# Patient Record
Sex: Male | Born: 1950 | Race: White | Hispanic: No | Marital: Married | Smoking: Never smoker
Health system: Southern US, Community
[De-identification: ages and names within clinical notes are randomized; demographics above are authoritative.]

## PROBLEM LIST (undated history)

## (undated) DIAGNOSIS — I10 Essential (primary) hypertension: Secondary | ICD-10-CM

## (undated) DIAGNOSIS — E785 Hyperlipidemia, unspecified: Secondary | ICD-10-CM

## (undated) DIAGNOSIS — I219 Acute myocardial infarction, unspecified: Secondary | ICD-10-CM

## (undated) HISTORY — DX: Essential (primary) hypertension: I10

## (undated) HISTORY — DX: Hyperlipidemia, unspecified: E78.5

## (undated) HISTORY — DX: Acute myocardial infarction, unspecified: I21.9

---

## 2003-12-25 HISTORY — PX: CORONARY ARTERY BYPASS GRAFT: SHX141

## 2004-01-17 ENCOUNTER — Inpatient Hospital Stay (HOSPITAL_COMMUNITY)
Admission: AD | Admit: 2004-01-17 | Discharge: 2004-01-23 | Payer: Self-pay | Admitting: Thoracic Surgery (Cardiothoracic Vascular Surgery)

## 2004-05-26 ENCOUNTER — Encounter: Payer: Self-pay | Admitting: Internal Medicine

## 2005-10-24 ENCOUNTER — Ambulatory Visit: Payer: Self-pay | Admitting: Internal Medicine

## 2009-08-31 HISTORY — PX: JOINT REPLACEMENT: SHX530

## 2009-09-04 ENCOUNTER — Ambulatory Visit: Payer: Self-pay | Admitting: Specialist

## 2009-09-20 ENCOUNTER — Inpatient Hospital Stay: Payer: Self-pay | Admitting: Specialist

## 2009-09-25 ENCOUNTER — Encounter: Payer: Self-pay | Admitting: Internal Medicine

## 2009-09-26 ENCOUNTER — Encounter: Payer: Self-pay | Admitting: Internal Medicine

## 2011-08-31 LAB — HM COLONOSCOPY: HM Colonoscopy: 1

## 2011-09-10 ENCOUNTER — Ambulatory Visit: Payer: Self-pay | Admitting: General Surgery

## 2011-09-10 LAB — HM COLONOSCOPY

## 2011-09-12 LAB — PATHOLOGY REPORT

## 2013-09-07 ENCOUNTER — Ambulatory Visit: Payer: Self-pay | Admitting: Family Medicine

## 2013-09-07 LAB — PSA: PSA: 1.2

## 2013-09-07 LAB — TSH: TSH: 2.24 u[IU]/mL (ref <=5.90)

## 2014-07-19 DIAGNOSIS — I25810 Atherosclerosis of coronary artery bypass graft(s) without angina pectoris: Secondary | ICD-10-CM | POA: Insufficient documentation

## 2014-10-24 IMAGING — CR DG CHEST 2V
1 series · 2 of 2 positions shown · non-contrast
Comparison: None.

CLINICAL DATA: One-week history of dyspnea, previous cardiac bowel
surgery and coronary artery bypass

EXAM:
CHEST  2 VIEW

[Series 1: pa · 0.17mm/px · 2 of 2 slices shown]
[im 1/2]
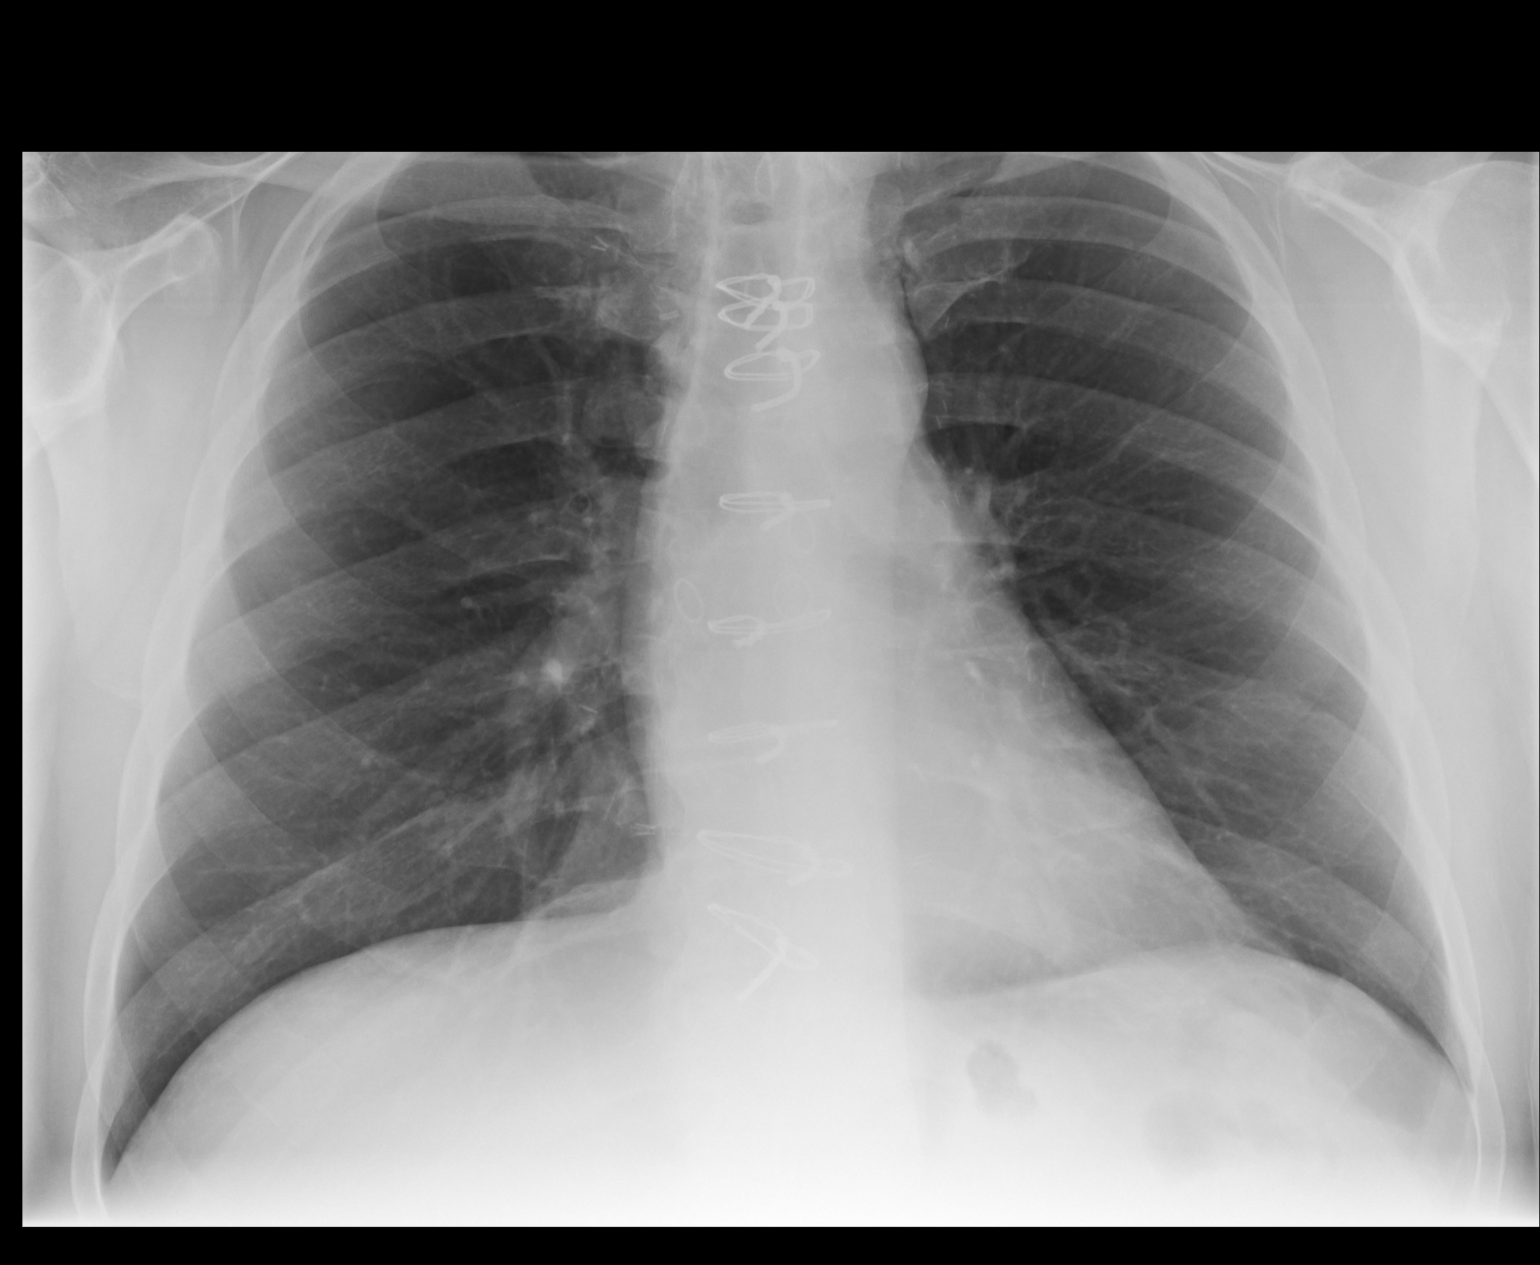
[im 2/2]
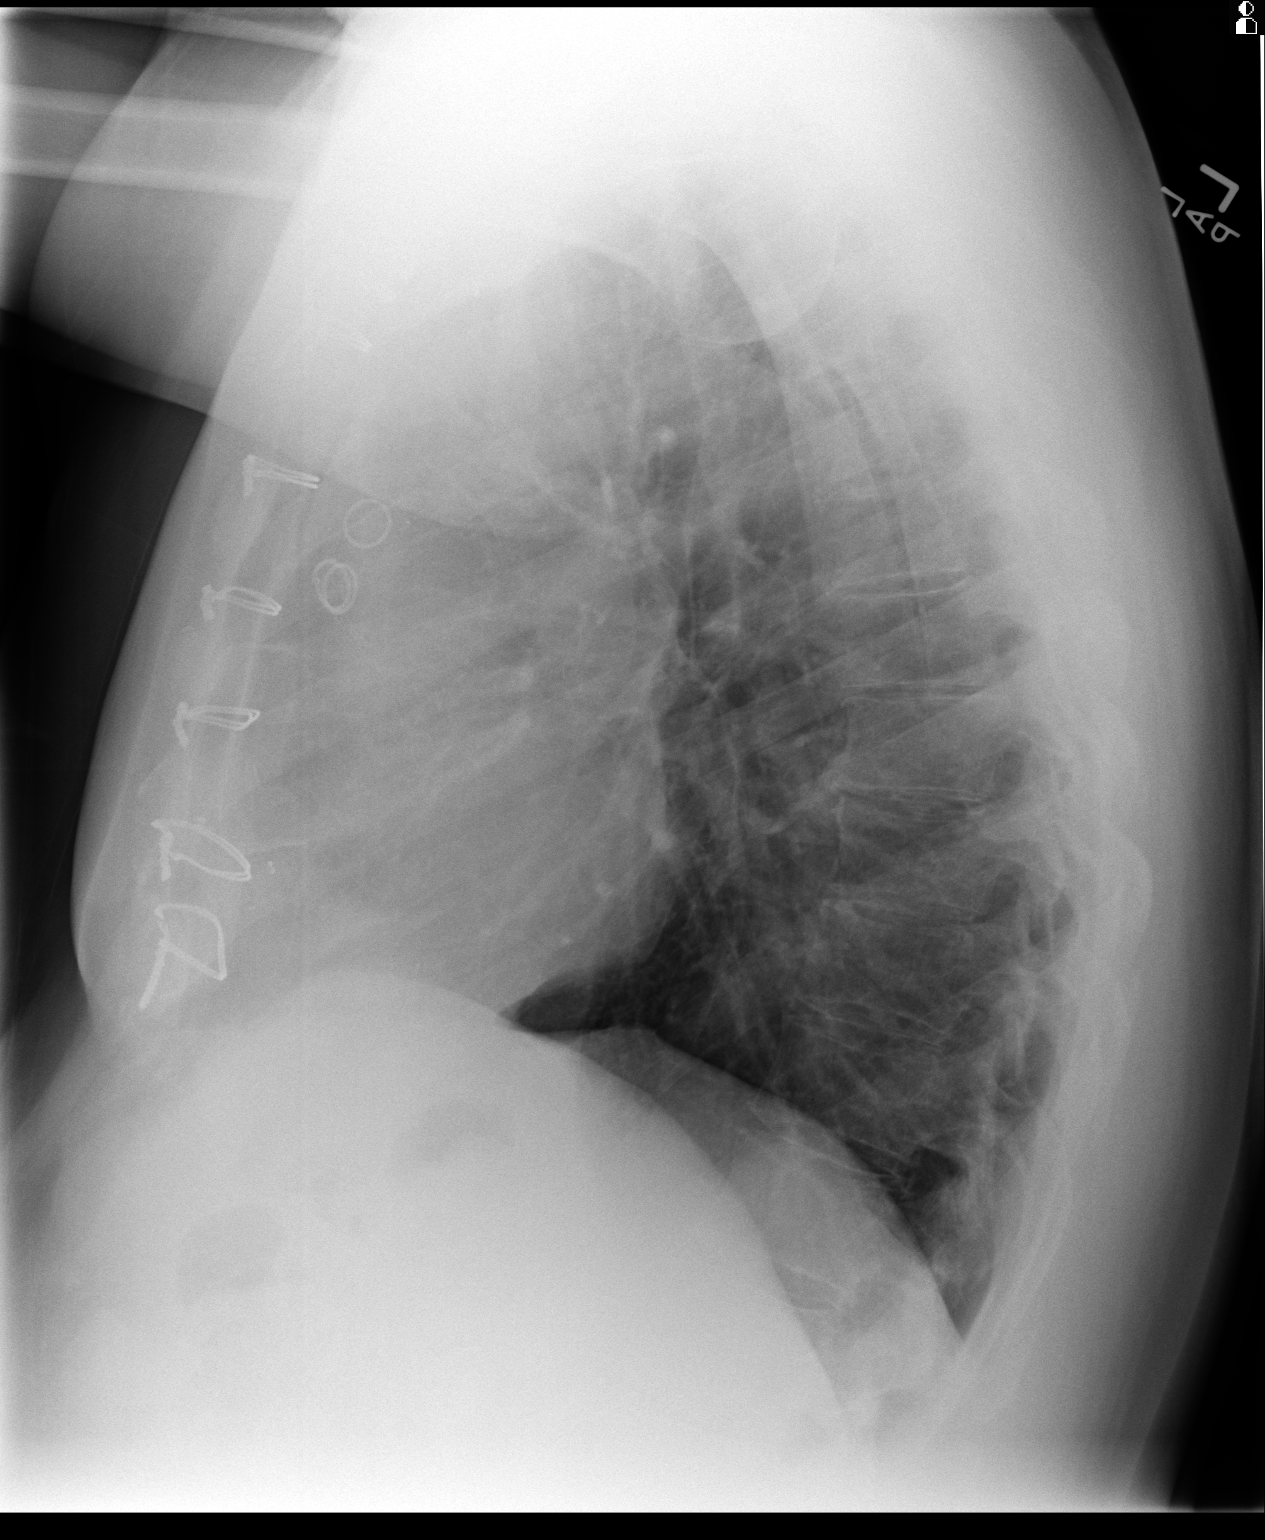

[2 of 2 positions shown; findings below may reference images not displayed]

FINDINGS: The lungs are adequately inflated and clear. The cardiopericardial
silhouette is normal in size. The pulmonary vascularity is not
engorged. The contour of the thoracic aorta is normal. There is no
pleural effusion. The mediastinum is normal in width. The observed
portions of the bony thorax appear normal.
IMPRESSION: There is no evidence of CHF nor other acute cardiopulmonary
abnormality.

## 2014-11-26 DIAGNOSIS — E785 Hyperlipidemia, unspecified: Secondary | ICD-10-CM | POA: Insufficient documentation

## 2014-11-26 DIAGNOSIS — M1990 Unspecified osteoarthritis, unspecified site: Secondary | ICD-10-CM | POA: Insufficient documentation

## 2014-11-26 DIAGNOSIS — I10 Essential (primary) hypertension: Secondary | ICD-10-CM | POA: Insufficient documentation

## 2014-11-26 DIAGNOSIS — I2510 Atherosclerotic heart disease of native coronary artery without angina pectoris: Secondary | ICD-10-CM | POA: Insufficient documentation

## 2014-11-26 DIAGNOSIS — K5790 Diverticulosis of intestine, part unspecified, without perforation or abscess without bleeding: Secondary | ICD-10-CM | POA: Insufficient documentation

## 2014-12-30 ENCOUNTER — Other Ambulatory Visit: Payer: Self-pay | Admitting: Family Medicine

## 2014-12-30 ENCOUNTER — Ambulatory Visit
Admission: RE | Admit: 2014-12-30 | Discharge: 2014-12-30 | Disposition: A | Discharge status: Home / Self Care | Payer: Federal, State, Local not specified - PPO | Source: Ambulatory Visit | Attending: Family Medicine | Admitting: Family Medicine

## 2014-12-30 ENCOUNTER — Ambulatory Visit
Admission: RE | Admit: 2014-12-30 | Discharge: 2014-12-30 | Disposition: A | Discharge status: Home / Self Care | Payer: Federal, State, Local not specified - PPO | Attending: Family Medicine | Admitting: Family Medicine

## 2014-12-30 DIAGNOSIS — R05 Cough: Secondary | ICD-10-CM

## 2014-12-30 DIAGNOSIS — Z951 Presence of aortocoronary bypass graft: Secondary | ICD-10-CM | POA: Insufficient documentation

## 2014-12-30 DIAGNOSIS — R059 Cough, unspecified: Secondary | ICD-10-CM

## 2015-02-24 ENCOUNTER — Encounter: Payer: Self-pay | Admitting: Family Medicine

## 2015-02-24 ENCOUNTER — Other Ambulatory Visit: Payer: Self-pay

## 2015-02-24 ENCOUNTER — Ambulatory Visit (INDEPENDENT_AMBULATORY_CARE_PROVIDER_SITE_OTHER): Payer: Federal, State, Local not specified - PPO | Admitting: Family Medicine

## 2015-02-24 VITALS — BP 120/74 | HR 96 | Temp 97.6°F | Resp 16 | Ht 73.0 in | Wt 220.0 lb

## 2015-02-24 DIAGNOSIS — Z0000 Encounter for general adult medical examination without abnormal findings: Secondary | ICD-10-CM

## 2015-02-24 NOTE — Progress Notes (Signed)
Patient ID: Kenneth Fuentes, male   DOB: 02-07-1951, 64 y.o.   MRN: [id number]       Patient: Kenneth Fuentes, Male    DOB: 02-Sep-1950, 64 y.o.   MRN: [id number] Visit Date: 02/24/2015  Today's Provider: Margarita Rana, MD   Chief Complaint  Patient presents with  . Annual Exam   Subjective:    Annual physical exam Kenneth Fuentes is a 64 y.o. male who presents today for health maintenance and complete physical. He feels well. He reports exercising "some", walking. He reports he is sleeping well. Pt sees cardiology. No current cardiac symptoms. Enjoying retirement.   Last CPE- 09/06/2013 Last Colonoscopy- 09/10/2011 (polyp and diverticulitis) Pneumovax 23- 07/30/2014  -----------------------------------------------------------------   Review of Systems  Constitutional: Negative.   HENT: Negative.   Eyes: Negative.   Respiratory: Negative.   Cardiovascular: Negative.   Gastrointestinal: Negative.   Endocrine: Negative.   Genitourinary: Negative.   Musculoskeletal: Negative.   Skin: Negative.   Allergic/Immunologic: Negative.   Neurological: Negative.   Hematological: Negative.   Psychiatric/Behavioral: Negative.      Social History He  reports that he has never smoked. He does not have any smokeless tobacco history on file. He reports that he does not drink alcohol or use illicit drugs.  Patient Active Problem List   Diagnosis Date Noted  . Atherosclerosis of coronary artery 11/26/2014  . DD (diverticular disease) 11/26/2014  . HLD (hyperlipidemia) 11/26/2014  . BP (high blood pressure) 11/26/2014  . Arthritis, degenerative 11/26/2014  . 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 Coronary artery bypass graft  12/2003  . Joint replacement Bilateral 08/31/2009    Family History His family history includes CAD in his father and mother; Down syndrome in his daughter; Heart attack in his  brother, brother, father, and mother; Hypertension in his father and mother.    Previous Medications   ASPIRIN 81 MG TABLET    Take by mouth daily.   ATORVASTATIN (LIPITOR) 40 MG TABLET    Take by mouth daily.   RAMIPRIL (ALTACE) 2.5 MG CAPSULE    Take by mouth.   SILDENAFIL (VIAGRA) 100 MG TABLET    Take by mouth.    Patient Care Team: Margarita Rana, MD as PCP - General (Family Medicine)     Objective:   Vitals: BP 120/74 mmHg  Pulse 96  Temp(Src) 97.6 F (36.4 C) (Oral)  Resp 16  Ht 6\' 1"  (1.854 m)  Wt 220 lb (99.791 kg)  BMI 29.03 kg/m2  Physical Exam  Constitutional: He is oriented to person, place, and time. He appears well-developed and well-nourished.  HENT:  Head: Normocephalic and atraumatic.  Right Ear: External ear normal.  Left Ear: External ear normal.  Nose: Nose normal.  Mouth/Throat: Oropharynx is clear and moist.  Eyes: Conjunctivae and EOM are normal. Pupils are equal, round, and reactive to light.  Neck: Normal range of motion. Neck supple. No tracheal deviation present. No thyromegaly present.  Cardiovascular: Normal rate, regular rhythm and normal heart sounds.   Pulmonary/Chest: Effort normal and breath sounds normal. No respiratory distress. He has no wheezes. He has no rales. He exhibits no tenderness.  Abdominal: Soft. Bowel sounds are normal.  Musculoskeletal: Normal range of motion. He exhibits no edema or tenderness.  Lymphadenopathy:    He has no cervical adenopathy.  Neurological: He is alert and oriented to person, place, and time. He has normal  reflexes.  Skin: Skin is warm and dry.  Psychiatric: He has a normal mood and affect. His behavior is normal. Judgment and thought content normal.     Depression Screen PHQ 2/9 Scores 02/24/2015  PHQ - 2 Score 0      Assessment & Plan:     Routine Health Maintenance and Physical Exam  Exercise Activities and Dietary recommendations Goals    None      Immunization History    Administered Date(s) Administered  . Pneumococcal Polysaccharide-23 07/05/2004, 07/30/2014  . Tdap 07/30/2014    Health Maintenance  Topic Date Due  . HIV Screening  12/22/1964  . ZOSTAVAX  12/22/2009  . INFLUENZA VACCINE  03/27/2015  . PNA vac Low Risk Adult (1 of 2 - PCV13) 07/31/2015  . COLONOSCOPY  09/09/2021  . TETANUS/TDAP  07/30/2024      Discussed health benefits of physical activity, and encouraged him to engage in regular exercise appropriate for his age and condition.    --------------------------------------------------------------------  Pt aware his PCV 13 is due in December 2016. Defer EKG to cardiology. Colonoscopy UTD. Advised pt of pros vs cons of checking PSA yearly. Pt agrees to defer this test this year, but will check next year at physical.     Patient seen and examined by Jerrell Belfast, MD, and note scribed by Renaldo Fiddler, CMA. I have reviewed the document for accuracy and completeness and I agree with above. Jerrell Belfast, MD    Margarita Rana, MD

## 2015-02-24 NOTE — Progress Notes (Deleted)
Patient ID: Kenneth Fuentes, male   DOB: [birth year], 64 y.o.   MRN: [id number]       Patient: Kenneth Fuentes, Male    DOB: May 09, 1951, 63 y.o.   MRN: [id number] Visit Date: 02/24/2015  Today's Provider: Margarita Rana, MD   No chief complaint on file.  Subjective:    Annual wellness visit Kenneth Fuentes is a 64 y.o. male who presents today for his [employer] to [employer] Visit. He feels {DESC; WELL/FAIRLY WELL/POORLY:18703}. He reports exercising ***. He reports he is sleeping {DESC; WELL/FAIRLY WELL/POORLY:18703}.  -----------------------------------------------------------   Review of Systems  History   Social History  . Marital Status: Married    Spouse Name: N/A  . Number of Children: N/A  . Years of Education: N/A   Occupational History  . Not on file.   Social History Main Topics  . Smoking status: Never Smoker   . Smokeless tobacco: Not on file  . Alcohol Use: No  . Drug Use: No  . Sexual Activity: Not on file   Other Topics Concern  . Not on file   Social History Narrative    Patient Active Problem List   Diagnosis Date Noted  . Atherosclerosis of coronary artery 11/26/2014  . DD (diverticular disease) 11/26/2014  . HLD (hyperlipidemia) 11/26/2014  . BP (high blood pressure) 11/26/2014  . Arthritis, degenerative 11/26/2014  . 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 Coronary artery bypass graft  12/2003  . Joint replacement Bilateral 08/31/2009    His family history includes CAD in his father and mother; Down syndrome in his daughter; Heart attack in his brother, brother, father, and mother; Hypertension in his father and mother.    Previous Medications   ASPIRIN 81 MG TABLET    Take by mouth daily.   ATORVASTATIN (LIPITOR) 40 MG TABLET    Take by mouth daily.   RAMIPRIL (ALTACE) 2.5 MG CAPSULE    Take by mouth.   SILDENAFIL (VIAGRA) 100 MG TABLET    Take  by mouth.    Patient Care Team: Margarita Rana, MD as PCP - General (Family Medicine)     Objective:   Vitals: There were no vitals taken for this visit.  Physical Exam  Activities of Daily Living No flowsheet data found.  Fall Risk Assessment No flowsheet data found.   Depression Screen No flowsheet data found.  Cognitive Testing - 6-CIT  Correct? Score   What year is it? {yes no:22349} {0-4:31231} 0 or 4  What month is it? {yes no:22349} {0-3:21082} 0 or 3  Memorize:    Pia Mau,  41,  [address]  [place],      What time is it? (within 1 hour) {yes no:22349} {0-3:21082} 0 or 3  Count backwards from 20 {yes no:22349} {0-4:31231} 0, 2, or 4  Name the months of the year {yes no:22349} {0-4:31231} 0, 2, or 4  Repeat name & address above {yes no:22349} {0-10:5044} 0, 2, 4, 6, 8, or 10       TOTAL SCORE  ***/28   Interpretation:  {normal/abnormal:11317::"Normal"}  Normal (0-7) Abnormal (8-28)       Assessment & Plan:     Annual Wellness Visit  Reviewed patient's Family Medical History Reviewed and updated list of patient's medical providers Assessment of cognitive impairment was done Assessed patient's functional ability Established a written schedule for health screening [place] Completed  and Reviewed  Exercise Activities and Dietary recommendations Goals    None      Immunization History  Administered Date(s) Administered  . Pneumococcal Polysaccharide-23 07/05/2004, 07/30/2014  . Tdap 07/30/2014    Health Maintenance  Topic Date Due  . HIV Screening  12/22/1964  . ZOSTAVAX  12/22/2009  . INFLUENZA VACCINE  03/27/2015  . PNA vac Low Risk Adult (1 of 2 - PCV13) 07/31/2015  . COLONOSCOPY  09/09/2021  . TETANUS/TDAP  07/30/2024      Discussed health benefits of physical activity, and encouraged him to engage in regular exercise appropriate for his age and condition.      ------------------------------------------------------------------------------------------------------------

## 2015-03-01 ENCOUNTER — Encounter: Payer: Self-pay | Admitting: Family Medicine

## 2015-08-01 DIAGNOSIS — R001 Bradycardia, unspecified: Secondary | ICD-10-CM | POA: Insufficient documentation

## 2016-02-09 ENCOUNTER — Ambulatory Visit (INDEPENDENT_AMBULATORY_CARE_PROVIDER_SITE_OTHER): Payer: Federal, State, Local not specified - PPO | Admitting: Family Medicine

## 2016-02-09 ENCOUNTER — Encounter: Payer: Self-pay | Admitting: Family Medicine

## 2016-02-09 VITALS — BP 116/80 | HR 84 | Temp 98.4°F | Resp 16 | Ht 73.0 in | Wt 216.0 lb

## 2016-02-09 DIAGNOSIS — Z1159 Encounter for screening for other viral diseases: Secondary | ICD-10-CM | POA: Diagnosis not present

## 2016-02-09 DIAGNOSIS — E785 Hyperlipidemia, unspecified: Secondary | ICD-10-CM

## 2016-02-09 DIAGNOSIS — Z125 Encounter for screening for malignant neoplasm of prostate: Secondary | ICD-10-CM | POA: Diagnosis not present

## 2016-02-09 DIAGNOSIS — I10 Essential (primary) hypertension: Secondary | ICD-10-CM

## 2016-02-09 DIAGNOSIS — Z23 Encounter for immunization: Secondary | ICD-10-CM | POA: Diagnosis not present

## 2016-02-09 DIAGNOSIS — Z0000 Encounter for general adult medical examination without abnormal findings: Secondary | ICD-10-CM

## 2016-02-09 NOTE — Progress Notes (Signed)
Patient ID: [name], male   DOB: January 29, 1951, 65 y.o.   MRN: [id number]       Patient: Kenneth Fuentes, Male    DOB: 19-Feb-1951, 65 y.o.   MRN: [id number] Visit Date: 02/09/2016  Today's Provider: Margarita Rana, MD   Chief Complaint  Patient presents with  . Annual Exam   Subjective:    Annual physical exam Kenneth Fuentes is a 65 y.o. male who presents today for health maintenance and complete physical. He feels well. He reports exercising 4 days a week. He reports he is sleeping fairly well. 02/24/15 CPE 09/10/11 Colonoscopy-diverticulosis, Dr. Bary Castilla 12/01/09 EKG 09/07/13 PSA 1.2 -----------------------------------------------------------------   Review of Systems  Constitutional: Negative.   HENT: Negative.   Eyes: Negative.   Respiratory: Negative.   Cardiovascular: Negative.   Gastrointestinal: Negative.   Endocrine: Negative.   Genitourinary: Negative.   Musculoskeletal: Negative.   Skin: Negative.   Allergic/Immunologic: Negative.   Neurological: Negative.   Hematological: Negative.   Psychiatric/Behavioral: Negative.     Social History      He  reports that he has never smoked. He has never used smokeless tobacco. He reports that he drinks about 6.0 oz of alcohol per week. He reports that he does not use illicit drugs.       Social History   Social History  . Marital Status: Married    Spouse Name: Maricel  . Number of Children: 1  . Years of Education: 17   Occupational History  . RETIRED [employer]   Social History Main Topics  . Smoking status: Never Smoker   . Smokeless tobacco: Never Used  . Alcohol Use: 6.0 oz/week    10 Glasses of wine per week     Comment: occasional  . Drug Use: No  . Sexual Activity: Not Asked   Other Topics Concern  . None   Social History Narrative    Past Medical History  Diagnosis Date  . Myocardial infarction ([place])   . Hypertension   . Hyperlipidemia      Patient Active Problem List    Diagnosis Date Noted  . Bradycardia 08/01/2015  . Atherosclerosis of coronary artery 11/26/2014  . DD (diverticular disease) 11/26/2014  . HLD (hyperlipidemia) 11/26/2014  . BP (high blood pressure) 11/26/2014  . Arthritis, degenerative 11/26/2014  . 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 Coronary artery bypass graft  12/2003  . Joint replacement Bilateral 08/31/2009    TKR x 2    Family History        Family Status  Relation Status Death Age  . Mother Deceased 85  . Father Deceased 56  . Daughter Alive   . Sister Alive   . Sister Alive         His family history includes CAD in his father and mother; Down syndrome in his daughter; Heart attack in his brother, brother, father, and mother; Heart murmur in his sister; Hypertension in his father and mother.    No Known Allergies  Current Meds  Medication Sig  . aspirin 81 MG tablet Take by mouth daily.  Marland Kitchen atorvastatin (LIPITOR) 40 MG tablet Take 80 mg by mouth daily.   . ramipril (ALTACE) 2.5 MG capsule Take 5 mg by mouth daily.   . sildenafil (VIAGRA) 100 MG tablet Take by mouth.    Patient Care Team: Margarita Rana, MD as PCP - General (Family  Medicine)     Objective:   Vitals: BP 116/80 mmHg  Pulse 84  Temp(Src) 98.4 F (36.9 C) (Oral)  Resp 16  Ht 6\' 1"  (1.854 m)  Wt 216 lb (97.977 kg)  BMI 28.50 kg/m2  SpO2 96%   Physical Exam  Constitutional: He is oriented to person, place, and time. He appears well-developed and well-nourished.  HENT:  Head: Normocephalic and atraumatic.  Right Ear: External ear normal.  Left Ear: External ear normal.  Nose: Nose normal.  Mouth/Throat: Oropharynx is clear and moist.  Eyes: Conjunctivae and EOM are normal. Pupils are equal, round, and reactive to light.  Neck: Normal range of motion. Neck supple.  Cardiovascular: Normal rate, regular rhythm and normal heart sounds.   Pulmonary/Chest:  Effort normal and breath sounds normal.  Abdominal: Soft. Bowel sounds are normal.  Musculoskeletal: Normal range of motion.  Neurological: He is alert and oriented to person, place, and time.  Skin: Skin is warm and dry.  Psychiatric: He has a normal mood and affect. His behavior is normal. Judgment and thought content normal.     Depression Screen PHQ 2/9 Scores 02/09/2016 02/24/2015  PHQ - 2 Score 0 0     Assessment & Plan:     Routine Health Maintenance and Physical Exam  Exercise Activities and Dietary recommendations Goals    None      Immunization History  Administered Date(s) Administered  . Influenza-Unspecified 05/29/2015  . Pneumococcal Conjugate-13 02/09/2016  . Pneumococcal Polysaccharide-23 07/05/2004, 07/30/2014  . Tdap 07/30/2014      1. Annual physical exam Stable. Patient advised to continue eating healthy and exercise daily.  2. Need for pneumococcal vaccination - Pneumococcal conjugate vaccine 13-valent IM  3. Essential hypertension F/U pending lab report. - CBC with Differential/Platelet - Comprehensive metabolic panel  4. HLD (hyperlipidemia) - Lipid Panel With LDL/HDL Ratio - TSH  5. Need for hepatitis C screening test - Hepatitis C antibody  6. Prostate cancer screening - PSA   Patient seen and examined by Dr. Jerrell Belfast, and note scribed by Philbert Riser. Dimas, CMA.  I have reviewed the document for accuracy and completeness and I agree with above. - Jerrell Belfast, MD   Margarita Rana, MD  [facility]

## 2016-02-14 ENCOUNTER — Other Ambulatory Visit: Payer: Self-pay | Admitting: Family Medicine

## 2016-02-15 ENCOUNTER — Telehealth: Payer: Self-pay

## 2016-02-15 LAB — COMPREHENSIVE METABOLIC PANEL
ALBUMIN: 4.3 g/dL (ref 3.6–4.8)
ALK PHOS: 61 IU/L (ref 39–117)
ALT: 24 IU/L (ref 0–44)
AST: 18 IU/L (ref 0–40)
Albumin/Globulin Ratio: 1.5 (ref 1.2–2.2)
BUN / CREAT RATIO: 16 (ref 10–24)
BUN: 15 mg/dL (ref 8–27)
Bilirubin Total: 0.3 mg/dL (ref 0.0–1.2)
CO2: 23 mmol/L (ref 18–29)
CREATININE: 0.91 mg/dL (ref 0.76–1.27)
Calcium: 9.2 mg/dL (ref 8.6–10.2)
Chloride: 104 mmol/L (ref 96–106)
GFR calc non Af Amer: 88 mL/min/{1.73_m2} (ref >59)
GFR, EST AFRICAN AMERICAN: 101 mL/min/{1.73_m2} (ref >59)
GLUCOSE: 90 mg/dL (ref 65–99)
Globulin, Total: 2.8 g/dL (ref 1.5–4.5)
Potassium: 3.9 mmol/L (ref 3.5–5.2)
Sodium: 147 mmol/L — ABNORMAL HIGH (ref 134–144)
TOTAL PROTEIN: 7.1 g/dL (ref 6.0–8.5)

## 2016-02-15 LAB — CBC WITH DIFFERENTIAL/PLATELET
BASOS ABS: 0 10*3/uL (ref 0.0–0.2)
Basos: 1 %
EOS (ABSOLUTE): 0.4 10*3/uL (ref 0.0–0.4)
Eos: 6 %
HEMOGLOBIN: 14.4 g/dL (ref 12.6–17.7)
Hematocrit: 41 % (ref 37.5–51.0)
IMMATURE GRANS (ABS): 0 10*3/uL (ref 0.0–0.1)
IMMATURE GRANULOCYTES: 1 %
LYMPHS: 17 %
Lymphocytes Absolute: 1 10*3/uL (ref 0.7–3.1)
MCH: 31.3 pg (ref 26.6–33.0)
MCHC: 35.1 g/dL (ref 31.5–35.7)
MCV: 89 fL (ref 79–97)
MONOCYTES: 10 %
Monocytes Absolute: 0.7 10*3/uL (ref 0.1–0.9)
NEUTROS PCT: 65 %
Neutrophils Absolute: 4.1 10*3/uL (ref 1.4–7.0)
PLATELETS: 223 10*3/uL (ref 150–379)
RBC: 4.6 x10E6/uL (ref 4.14–5.80)
RDW: 13.8 % (ref 12.3–15.4)
WBC: 6.2 10*3/uL (ref 3.4–10.8)

## 2016-02-15 LAB — HEPATITIS C ANTIBODY: Hep C Virus Ab: <0.1 s/co ratio (ref 0.0–0.9)

## 2016-02-15 LAB — LIPID PANEL WITH LDL/HDL RATIO
CHOLESTEROL TOTAL: 146 mg/dL (ref 100–199)
HDL: 52 mg/dL (ref >39)
LDL Calculated: 81 mg/dL (ref 0–99)
LDl/HDL Ratio: 1.6 ratio units (ref 0.0–3.6)
TRIGLYCERIDES: 66 mg/dL (ref 0–149)
VLDL Cholesterol Cal: 13 mg/dL (ref 5–40)

## 2016-02-15 LAB — TSH: TSH: 2.28 u[IU]/mL (ref 0.450–4.500)

## 2016-02-15 LAB — PSA: Prostate Specific Ag, Serum: 1.6 ng/mL (ref 0.0–4.0)

## 2016-02-15 LAB — SPECIMEN STATUS REPORT

## 2016-02-15 NOTE — Telephone Encounter (Signed)
Patient advised as below.  

## 2016-02-15 NOTE — Telephone Encounter (Signed)
LMTCB 02/15/2016  Thanks,   -Mickel Baas

## 2016-02-15 NOTE — Telephone Encounter (Signed)
Notes Recorded by Margarita Rana, MD on 02/15/2016 at 6:16 AM Labs look great. Please notify patient. Thanks.

## 2016-02-16 ENCOUNTER — Telehealth: Payer: Self-pay

## 2016-02-16 NOTE — Telephone Encounter (Signed)
-----   Message from Margarita Rana, MD sent at 02/15/2016  2:18 PM EDT ----- Sodium mildly elevated. Make sure to stay well hydrated. Thanks.

## 2016-02-26 ENCOUNTER — Encounter: Payer: Federal, State, Local not specified - PPO | Admitting: Family Medicine

## 2019-05-17 ENCOUNTER — Other Ambulatory Visit: Payer: Self-pay

## 2019-05-17 DIAGNOSIS — Z20822 Contact with and (suspected) exposure to covid-19: Secondary | ICD-10-CM

## 2019-05-19 LAB — NOVEL CORONAVIRUS, NAA: SARS-CoV-2, NAA: NOT DETECTED

## 2019-07-19 ENCOUNTER — Other Ambulatory Visit: Payer: Self-pay

## 2019-07-19 DIAGNOSIS — Z20822 Contact with and (suspected) exposure to covid-19: Secondary | ICD-10-CM

## 2019-07-21 LAB — NOVEL CORONAVIRUS, NAA: SARS-CoV-2, NAA: NOT DETECTED

## 2019-11-25 ENCOUNTER — Other Ambulatory Visit: Payer: Self-pay

## 2019-11-25 ENCOUNTER — Emergency Department: Payer: Federal, State, Local not specified - PPO

## 2019-11-25 DIAGNOSIS — Z7982 Long term (current) use of aspirin: Secondary | ICD-10-CM | POA: Insufficient documentation

## 2019-11-25 DIAGNOSIS — I10 Essential (primary) hypertension: Secondary | ICD-10-CM | POA: Diagnosis present

## 2019-11-25 DIAGNOSIS — I252 Old myocardial infarction: Secondary | ICD-10-CM | POA: Diagnosis not present

## 2019-11-25 DIAGNOSIS — Z79899 Other long term (current) drug therapy: Secondary | ICD-10-CM | POA: Diagnosis not present

## 2019-11-25 LAB — CBC
HCT: 43.9 % (ref 39.0–52.0)
Hemoglobin: 14.8 g/dL (ref 13.0–17.0)
MCH: 30.9 pg (ref 26.0–34.0)
MCHC: 33.7 g/dL (ref 30.0–36.0)
MCV: 91.6 fL (ref 80.0–100.0)
Platelets: 221 10*3/uL (ref 150–400)
RBC: 4.79 MIL/uL (ref 4.22–5.81)
RDW: 12.9 % (ref 11.5–15.5)
WBC: 9.4 10*3/uL (ref 4.0–10.5)
nRBC: 0 % (ref 0.0–0.2)

## 2019-11-25 LAB — BASIC METABOLIC PANEL
Anion gap: 10 (ref 5–15)
BUN: 18 mg/dL (ref 8–23)
CO2: 22 mmol/L (ref 22–32)
Calcium: 9.2 mg/dL (ref 8.9–10.3)
Chloride: 109 mmol/L (ref 98–111)
Creatinine, Ser: 0.92 mg/dL (ref 0.61–1.24)
GFR calc Af Amer: >60 mL/min (ref >60)
GFR calc non Af Amer: >60 mL/min (ref >60)
Glucose, Bld: 112 mg/dL — ABNORMAL HIGH (ref 70–99)
Potassium: 3.7 mmol/L (ref 3.5–5.1)
Sodium: 141 mmol/L (ref 135–145)

## 2019-11-25 LAB — TROPONIN I (HIGH SENSITIVITY): Troponin I (High Sensitivity): 7 ng/L (ref <18)

## 2019-11-25 NOTE — ED Triage Notes (Signed)
Patient c/o hypertension (systolic BP > [id number]) beginning at 1100 this morning. Patient called cardiology and was told to double up on BP meds (sartan). Patient c/o medial/left chest tightness for the last few hours. Patient reports hx of quintuple bypass.

## 2019-11-26 ENCOUNTER — Emergency Department
Admission: EM | Admit: 2019-11-26 | Discharge: 2019-11-26 | Disposition: A | Discharge status: Home / Self Care | Payer: Federal, State, Local not specified - PPO | Attending: Emergency Medicine | Admitting: Emergency Medicine

## 2019-11-26 DIAGNOSIS — I10 Essential (primary) hypertension: Secondary | ICD-10-CM

## 2019-11-26 LAB — TROPONIN I (HIGH SENSITIVITY): Troponin I (High Sensitivity): 7 ng/L (ref <18)

## 2019-11-26 NOTE — Discharge Instructions (Addendum)

## 2019-11-26 NOTE — ED Provider Notes (Signed)
[facility] Emergency Department Provider Note  ____________________________________________   First MD Initiated Contact with Patient 11/26/19 0149     (approximate)  I have reviewed the triage vital signs and the nursing notes.   HISTORY  Chief Complaint Chest Pain    HPI Kenneth Fuentes is a 69 y.o. male with medical history as listed below which notably includes prior MI and a prior CABG.  He sees Dr. Nehemiah Massed for cardiology care.  He presents by private vehicle for evaluation of hypertension and intermittent mild chest pressure.  He said that he discovered that his blood pressure was substantially elevated over baseline this morning when he went to participate in an [employer] study in which she has been participating for last few months.  When he checked in this morning his blood pressure was over [id number] systolic which is atypical for him.  He was asymptomatic at the time.  Because he is well-established with Dr. Nehemiah Massed in cardiology, he called the clinic and they recommended that he take an extra dose of his telmisartan 40 mg  in addition to the usual evening dose and then follow-up in clinic and keep a log of his pressure.  His blood pressure went down briefly to about 160 but then went back up again to greater than 200 by this evening, and by tonight he was having some central mild chest pressure which is also atypical for him.  No trouble breathing.  He denies fever/chills, sore throat, nausea, vomiting, and abdominal pain.  He has had no numbness or tingling in his extremities.  Given the persistent hypertension and the intermittent and now resolved chest pressure, he came to the ED for further evaluation.  Nothing particular makes the symptoms better or worse and he is currently asymptomatic.        Past Medical History:  Diagnosis Date  . Hyperlipidemia   . Hypertension   . Myocardial infarction [facility])     Patient Active Problem List   Diagnosis Date  Noted  . Bradycardia 08/01/2015  . Atherosclerosis of coronary artery 11/26/2014  . DD (diverticular disease) 11/26/2014  . HLD (hyperlipidemia) 11/26/2014  . BP (high blood pressure) 11/26/2014  . Arthritis, degenerative 11/26/2014  . 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 CORONARY ARTERY BYPASS GRAFT  12/2003  . JOINT REPLACEMENT Bilateral 08/31/2009   TKR x 2    Prior to Admission medications   Medication Sig Start Date End Date Taking? Authorizing Provider  aspirin 81 MG tablet Take by mouth daily.   Yes [provider]  atorvastatin (LIPITOR) 80 MG tablet Take 80 mg by mouth daily. 10/08/19  Yes [provider]  ezetimibe (ZETIA) 10 MG tablet Take 10 mg by mouth daily. 10/03/19  Yes [provider]  telmisartan (MICARDIS) 40 MG tablet Take 40 mg by mouth daily. 10/08/19  Yes [provider]  ramipril (ALTACE) 2.5 MG capsule Take 5 mg by mouth daily.     [provider]  sildenafil (VIAGRA) 100 MG tablet Take by mouth. 07/25/14   [provider]    Allergies Patient has no known allergies.  Family History  Problem Relation Age of Onset  . Hypertension Mother   . CAD Mother   . Heart attack Mother   . Hypertension Father   . CAD Father   . Heart attack Father   . Down syndrome Daughter   . Heart  murmur Sister   . Heart attack Brother   . Heart attack Brother     Social History Social History   Tobacco Use  . Smoking status: Never Smoker  . Smokeless tobacco: Never Used  Substance Use Topics  . Alcohol use: Yes    Alcohol/week: 10.0 standard drinks    Types: 10 Glasses of wine per week    Comment: occasional  . Drug use: No    Review of Systems Constitutional: Hypertension.  No fever/chills Eyes: No visual changes. ENT: No sore throat. Cardiovascular: Intermittent mild chest pressure  Respiratory: Denies shortness of  breath. Gastrointestinal: No abdominal pain.  No nausea, no vomiting.  No diarrhea.  No constipation. Genitourinary: Negative for dysuria. Musculoskeletal: Negative for neck pain.  Negative for back pain. Integumentary: Negative for rash. Neurological: Negative for headaches, focal weakness or numbness.  ____________________________________________   PHYSICAL EXAM:  VITAL SIGNS: ED Triage Vitals  Enc Vitals Group     BP 11/25/19 2301 (!) 177/87     Pulse Rate 11/25/19 2301 (!) 18     Resp 11/25/19 2301 18     Temp 11/25/19 2301 97.9 F (36.6 C)     Temp src --      SpO2 11/25/19 2301 97 %     Weight 11/25/19 2303 97.5 kg (215 lb)     Height 11/25/19 2303 1.854 m (6\' 1" )     Head Circumference --      Peak Flow --      Pain Score 11/25/19 2302 4     Pain Loc --      Pain Edu? --      Excl. in [place]? --     Constitutional: Alert and oriented.  Eyes: Conjunctivae are normal.  Head: Atraumatic. Nose: No congestion/rhinnorhea. Mouth/Throat: Patient is wearing a mask. Neck: No stridor.  No meningeal signs.   Cardiovascular: Normal rate, regular rhythm. Good peripheral circulation. Grossly normal heart sounds. Respiratory: Normal respiratory effort.  No retractions. Gastrointestinal: Soft and nontender. No distention.  Musculoskeletal: No lower extremity tenderness nor edema. No gross deformities of extremities. Neurologic:  Normal speech and language. No gross focal neurologic deficits are appreciated.  Skin:  Skin is warm, dry and intact. Psychiatric: Mood and affect are normal. Speech and behavior are normal.  ____________________________________________   LABS (all labs ordered are listed, but only abnormal results are displayed)  Labs Reviewed  BASIC METABOLIC PANEL - Abnormal; Notable for the following components:      Result Value   Glucose, Bld 112 (*)    All other components within normal limits  CBC  TROPONIN I (HIGH SENSITIVITY)  TROPONIN I (HIGH  SENSITIVITY)   ____________________________________________  EKG  ED ECG REPORT I, Hinda Kehr, the attending physician, personally viewed and interpreted this ECG.  Date: 11/25/2019 EKG Time: 22: 54 Rate: 72 Rhythm: Sinus rhythm with premature atrial complexes the result in a pattern that looks like bigeminy interspersed with a single normal sinus QRS complex. QRS Axis: normal Intervals: Left anterior fascicular block ST/T Wave abnormalities: Non-specific ST segment / T-wave changes, but no clear evidence of acute ischemia.  There almost appears to be a U wave most notable in leads V5 and V6 but this may be the premature atrial complex. Narrative Interpretation: no definitive evidence of acute ischemia; does not meet STEMI criteria.   ____________________________________________  RADIOLOGY I, Hinda Kehr, personally viewed and evaluated these images (plain radiographs) as part of my medical decision making, as well as reviewing  the written report by the radiologist.  ED MD interpretation:  No acute abnormalities identified on CXR  Official radiology report(s): DG Chest 2 View  Result Date: 11/25/2019 CLINICAL DATA:  Chest pain for several hours EXAM: CHEST - 2 VIEW COMPARISON:  12/30/2014 FINDINGS: Cardiac shadows within normal limits. Postsurgical changes are noted. Lungs are clear. Minimal scarring is noted in the left base. No bony abnormality is noted. IMPRESSION: Minimal left basilar scarring.  No acute abnormality noted. Electronically Signed   By: Inez Catalina M.D.   On: 11/25/2019 23:35    ____________________________________________   PROCEDURES   Procedure(s) performed (including Critical Care):  Procedures   ____________________________________________   INITIAL IMPRESSION / MDM / [place] / ED COURSE  As part of my medical decision making, I reviewed the following data within the [place] notes reviewed and  incorporated, Labs reviewed , EKG interpreted , Old chart reviewed, Radiograph reviewed  and Notes from prior ED visits   Differential diagnosis includes, but is not limited to, essential hypertension, nonspecific secondary hypertension, ACS, stable angina, community-acquired pneumonia, COVID-19.  The patient has no infectious signs or symptoms.  Pulse rate was erroneously recorded as 18 in the system but his heart rate has been stable and not tachycardic.  He is well-appearing and in no distress.  His blood pressure remains elevated in the emergency department but it is highly variable, ranging from about 0000000 systolic to A999333 systolic.  Given the variability of the blood pressure I do not think he would be in his best interest to aggressively treat the blood pressure.  I explained this to him and his wife and they understand and agree.  Given that he is asymptomatic and had an essentially normal first troponin of 7, we discussed admission for the chest pressure and given his history as well as the blood pressure, versus checking a second troponin and close clinic follow-up tomorrow since he is a well-established patient.  His preference would be to not stay in the hospital if he does not have to and I think it is appropriate and reasonable; if there is no evidence of an emergent medical condition, I trust that he will follow-up as planned or return to the ED with worsening symptoms.  Second troponin is pending.  Patient is asymptomatic currently.  Lab work is otherwise reassuring with no electrolyte or metabolic abnormalities and a normal CBC.  Chest x-ray is clear and EKG is nonischemic.      Clinical Course as of Nov 26 351  Fri Nov 26, 2019  [id number] The patient's blood pressure is down to 138/70 with no medication intervention.  He is asymptomatic.  His second troponin is 7.  He is very comfortable with the plan for discharge and outpatient follow-up and I gave strict return precautions.  He  understands and agrees with the plan.   [CF]    Clinical Course User Index [CF] Hinda Kehr, MD     ____________________________________________  FINAL CLINICAL IMPRESSION(S) / ED DIAGNOSES  Final diagnoses:  Essential hypertension     MEDICATIONS GIVEN DURING THIS VISIT:  Medications - No data to display   ED Discharge Orders    None      *Please note:  ARMAD DUFOUR was evaluated in Emergency Department on 11/26/2019 for the symptoms described in the history of present illness. He was evaluated in the context of the global COVID-19 pandemic, which necessitated consideration that the patient might be at risk  for infection with the SARS-CoV-2 virus that causes COVID-19. Institutional protocols and algorithms that pertain to the evaluation of patients at risk for COVID-19 are in a state of rapid change based on information released by regulatory bodies including the CDC and federal and state organizations. These policies and algorithms were followed during the patient's care in the ED.  Some ED evaluations and interventions may be delayed as a result of limited staffing during the pandemic.*  Note:  This document was prepared using Dragon voice recognition software and may include unintentional dictation errors.   Hinda Kehr, MD 11/26/19 [phone]

## 2020-09-13 ENCOUNTER — Ambulatory Visit: Payer: Federal, State, Local not specified - PPO | Admitting: Dermatology

## 2020-09-13 ENCOUNTER — Other Ambulatory Visit: Payer: Self-pay

## 2020-09-13 DIAGNOSIS — L814 Other melanin hyperpigmentation: Secondary | ICD-10-CM | POA: Diagnosis not present

## 2020-09-13 DIAGNOSIS — Z1283 Encounter for screening for malignant neoplasm of skin: Secondary | ICD-10-CM

## 2020-09-13 DIAGNOSIS — L821 Other seborrheic keratosis: Secondary | ICD-10-CM | POA: Diagnosis not present

## 2020-09-13 DIAGNOSIS — L570 Actinic keratosis: Secondary | ICD-10-CM

## 2020-09-13 DIAGNOSIS — L820 Inflamed seborrheic keratosis: Secondary | ICD-10-CM

## 2020-09-13 DIAGNOSIS — L219 Seborrheic dermatitis, unspecified: Secondary | ICD-10-CM | POA: Diagnosis not present

## 2020-09-13 DIAGNOSIS — D1800 Hemangioma unspecified site: Secondary | ICD-10-CM

## 2020-09-13 DIAGNOSIS — D229 Melanocytic nevi, unspecified: Secondary | ICD-10-CM

## 2020-09-13 DIAGNOSIS — L578 Other skin changes due to chronic exposure to nonionizing radiation: Secondary | ICD-10-CM

## 2020-09-13 NOTE — Progress Notes (Addendum)
Follow-Up Visit   Subjective  Kenneth Fuentes is a 70 y.o. male who presents for the following: check spot (R scalp, 3-47m, no symptoms) and Upper body skin exam (Hx of Aks). The patient presents for Upper Body Skin Exam (UBSE) for skin cancer screening and mole check.  The following portions of the chart were reviewed this encounter and updated as appropriate:   Tobacco  Allergies  Meds  Problems  Med Hx  Surg Hx  Fam Hx     Review of Systems:  No other skin or systemic complaints except as noted in HPI or Assessment and Plan.  Objective  Well appearing patient in no apparent distress; mood and affect are within normal limits.  All skin waist up examined.  Objective  Scalp x 10 (10): Erythematous keratotic or waxy stuck-on papule or plaque.   Objective  face x 12 (12): Pink scaly macules   Objective  eyebrow, perinasal: Pink patches with greasy scale.    Assessment & Plan    Lentigines - Scattered tan macules - Discussed due to sun exposure - Benign, observe - Call for any changes  Seborrheic Keratoses - Stuck-on, waxy, tan-brown papules and plaques  - Discussed benign etiology and prognosis. - Observe - Call for any changes  Melanocytic Nevi - Tan-brown and/or pink-flesh-colored symmetric macules and papules - Benign appearing on exam today - Observation - Call clinic for new or changing moles - Recommend daily use of broad spectrum spf 30+ sunscreen to sun-exposed areas.   Hemangiomas - Red papules - Discussed benign nature - Observe - Call for any changes  Actinic Damage - Chronic, secondary to cumulative UV/sun exposure - diffuse scaly erythematous macules with underlying dyspigmentation - Recommend daily broad spectrum sunscreen SPF 30+ to sun-exposed areas, reapply every 2 hours as needed.  - Call for new or changing lesions.  Skin cancer screening performed today.  Inflamed seborrheic keratosis (10) Scalp x 10  Destruction of  lesion - Scalp x 10 Complexity: simple   Destruction method: cryotherapy   Informed consent: discussed and consent obtained   Timeout:  patient name, date of birth, surgical site, and procedure verified Lesion destroyed using liquid nitrogen: Yes   Region frozen until ice ball extended beyond lesion: Yes   Outcome: patient tolerated procedure well with no complications   Post-procedure details: wound care instructions given    AK (actinic keratosis) (12) face x 12  Destruction of lesion - face x 12 Complexity: simple   Destruction method: cryotherapy   Informed consent: discussed and consent obtained   Timeout:  patient name, date of birth, surgical site, and procedure verified Lesion destroyed using liquid nitrogen: Yes   Region frozen until ice ball extended beyond lesion: Yes   Outcome: patient tolerated procedure well with no complications   Post-procedure details: wound care instructions given    Seborrheic dermatitis eyebrow, perinasal Mild Start Hydrocortisone 1% 3 nights per week aa face Seborrheic Dermatitis  -  is a chronic persistent rash characterized by pinkness and scaling most commonly of the mid face but also can occur on the scalp (dandruff), ears; mid chest and mid back. It tends to be exacerbated by stress and cooler weather.  People who have neurologic disease may experience new onset or exacerbation of existing seborrheic dermatitis.  The condition is not curable but treatable and can be controlled. Pt declines topical Ketoconazole and topical 3.5% Hydrocortisone today.  Return for PRN, pt will find a Paediatric nurse in [place] where he  moved.   I, Othelia Pulling, RMA, am acting as scribe for Sarina Ser, MD .  Documentation: I have reviewed the above documentation for accuracy and completeness, and I agree with the above.  Sarina Ser, MD

## 2020-09-13 NOTE — Patient Instructions (Addendum)
Hydrocortisone 1% cream apply to scaly areas eyebrows and around nose 3 nights a week    Barnie Mort, MD - [employer] [phone]

## 2020-09-17 ENCOUNTER — Encounter: Payer: Self-pay | Admitting: Dermatology

## 2021-01-10 IMAGING — CR DG CHEST 2V
2 series · 2 of 2 positions shown · non-contrast
Comparison: 12/30/2014

CLINICAL DATA: Chest pain for several hours

EXAM:
CHEST - 2 VIEW

[chest pa]
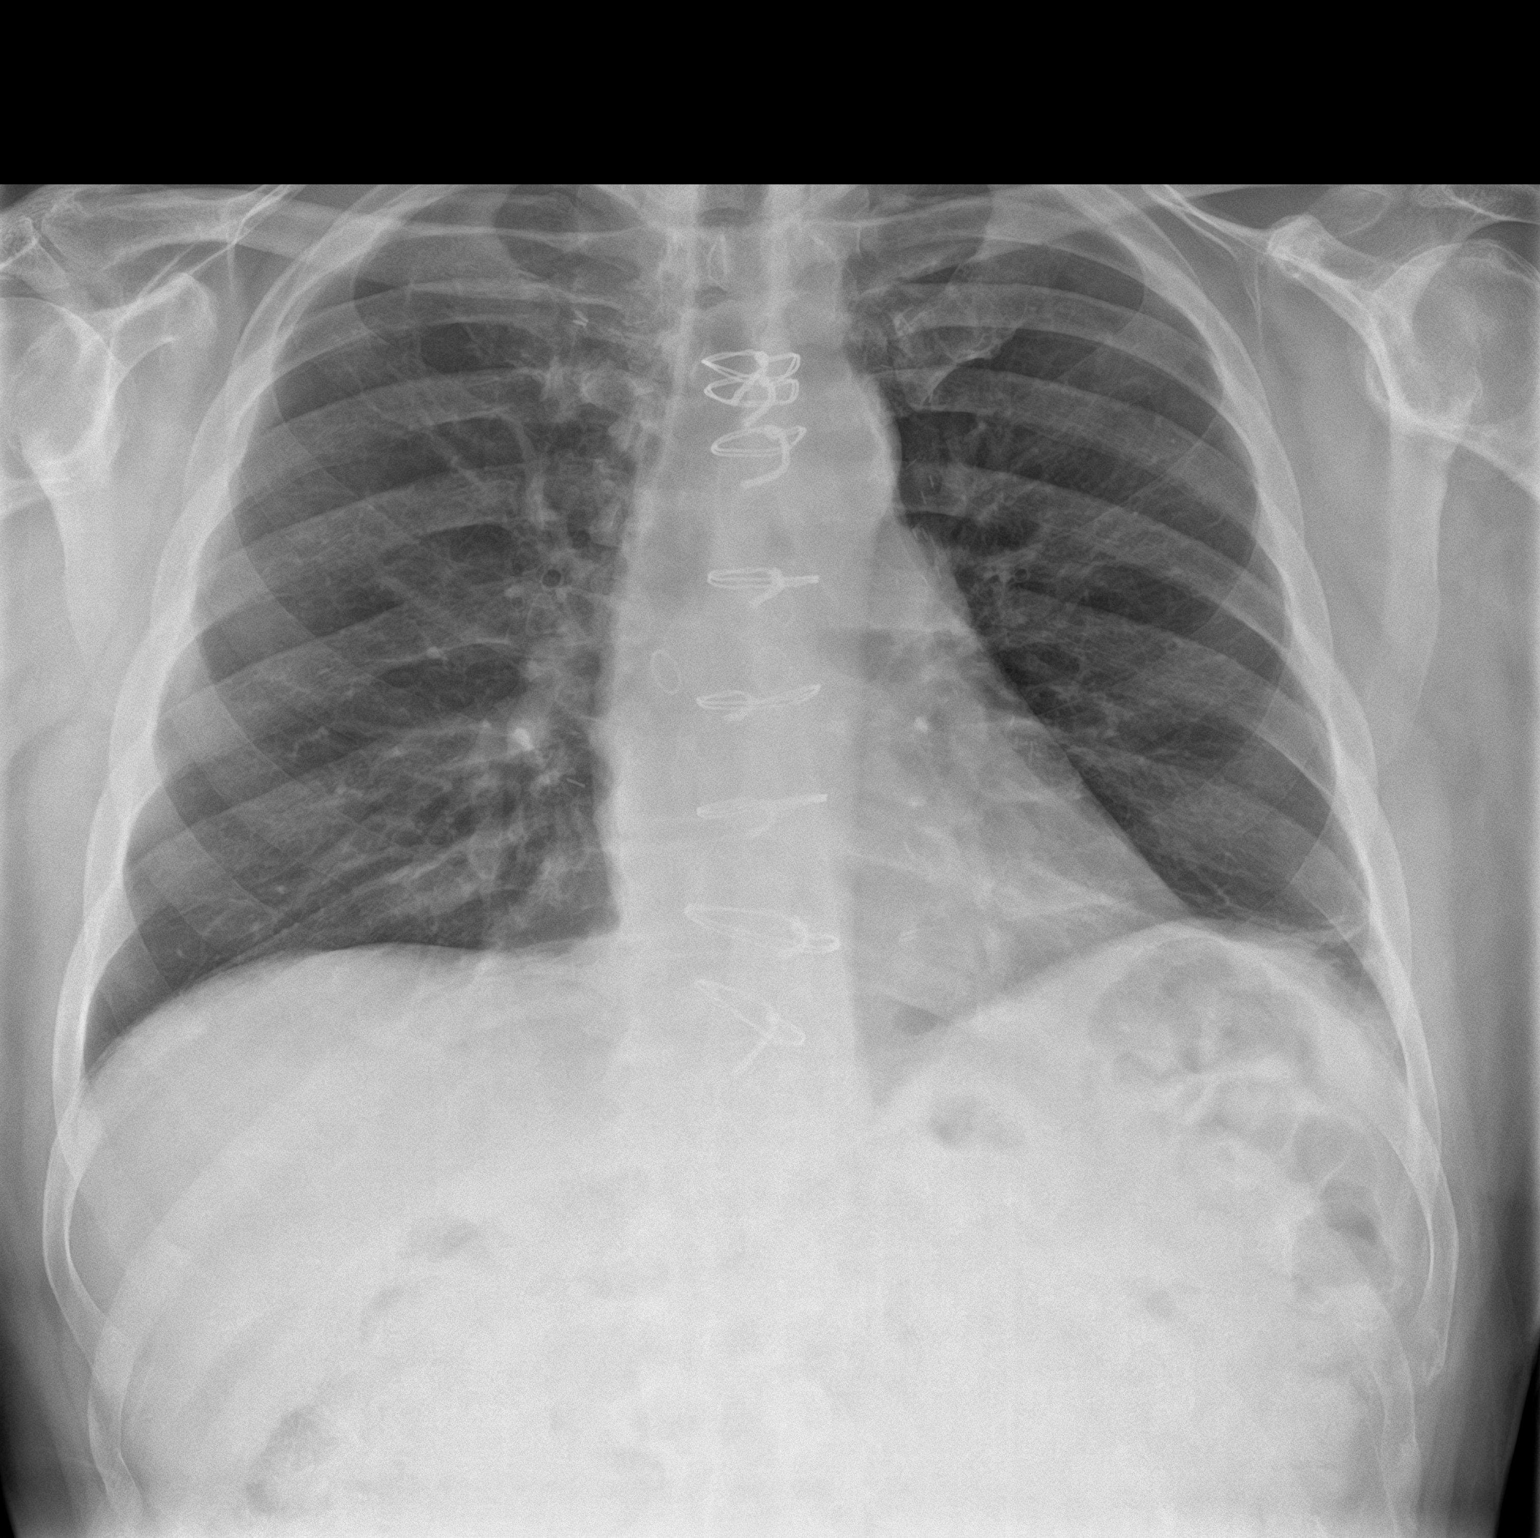

[chest lat]
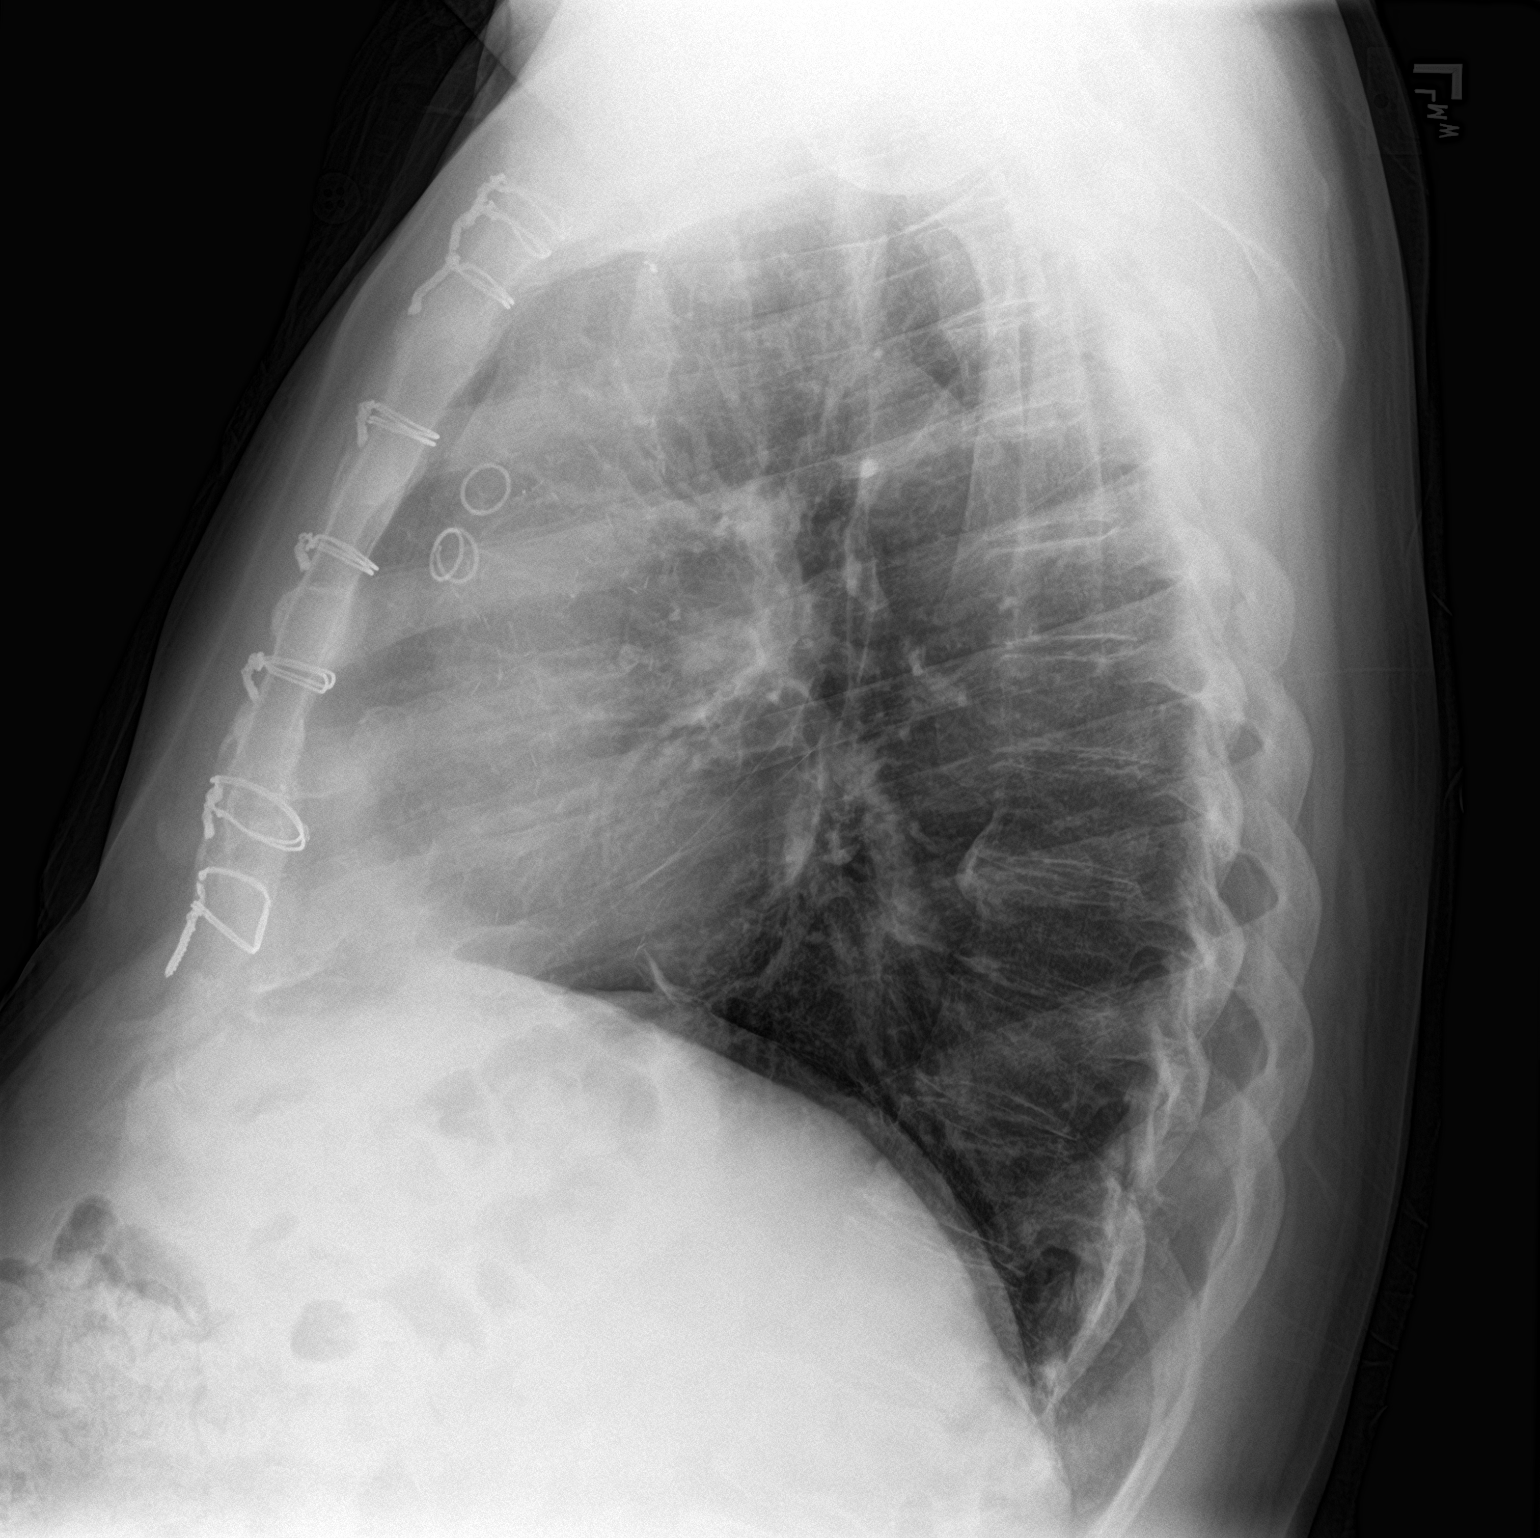

[2 of 2 positions shown; findings below may reference images not displayed]

FINDINGS: Cardiac shadows within normal limits. Postsurgical changes are
noted. Lungs are clear. Minimal scarring is noted in the left base.
No bony abnormality is noted.
IMPRESSION: Minimal left basilar scarring.  No acute abnormality noted.
